# Patient Record
Sex: Female | Born: 1940 | Race: Black or African American | Hispanic: No | Smoking: Never smoker
Health system: Southern US, Community
[De-identification: ages and names within clinical notes are randomized; demographics above are authoritative.]

---

## 2008-01-17 ENCOUNTER — Ambulatory Visit: Payer: Self-pay | Admitting: Obstetrics and Gynecology

## 2008-01-21 ENCOUNTER — Ambulatory Visit: Payer: Self-pay | Admitting: Gastroenterology

## 2009-03-03 ENCOUNTER — Ambulatory Visit: Payer: Self-pay | Admitting: Obstetrics and Gynecology

## 2010-04-14 ENCOUNTER — Ambulatory Visit: Payer: Self-pay | Admitting: Obstetrics and Gynecology

## 2011-05-15 ENCOUNTER — Ambulatory Visit: Payer: Self-pay | Admitting: Obstetrics and Gynecology

## 2012-05-17 ENCOUNTER — Ambulatory Visit: Payer: Self-pay | Admitting: Obstetrics and Gynecology

## 2013-05-19 ENCOUNTER — Ambulatory Visit: Payer: Self-pay | Admitting: Obstetrics and Gynecology

## 2014-05-21 ENCOUNTER — Ambulatory Visit: Payer: Self-pay | Admitting: Obstetrics and Gynecology

## 2015-04-02 ENCOUNTER — Ambulatory Visit: Payer: Self-pay

## 2015-04-02 ENCOUNTER — Ambulatory Visit (INDEPENDENT_AMBULATORY_CARE_PROVIDER_SITE_OTHER): Payer: Medicare HMO | Admitting: Sports Medicine

## 2015-04-02 ENCOUNTER — Encounter: Payer: Self-pay | Admitting: Sports Medicine

## 2015-04-02 VITALS — BP 135/81 | HR 81

## 2015-04-02 DIAGNOSIS — M79671 Pain in right foot: Secondary | ICD-10-CM

## 2015-04-02 DIAGNOSIS — M21619 Bunion of unspecified foot: Secondary | ICD-10-CM | POA: Diagnosis not present

## 2015-04-02 DIAGNOSIS — M2040 Other hammer toe(s) (acquired), unspecified foot: Secondary | ICD-10-CM | POA: Diagnosis not present

## 2015-04-02 DIAGNOSIS — M779 Enthesopathy, unspecified: Secondary | ICD-10-CM

## 2015-04-02 MED ORDER — TRIAMCINOLONE ACETONIDE 10 MG/ML IJ SUSP: 10.0000 mg | Freq: Once | INTRAMUSCULAR | Status: AC

## 2015-04-02 NOTE — Patient Instructions (Signed)
Bunion (Hallux Valgus) A bony bump (protrusion) on the inside of the foot, at the base of the first toe, is called a bunion (hallux valgus). A bunion causes the first toe to angle toward the other toes. SYMPTOMS   A bony bump on the inside of the foot, causing an outward turning of the first toe. It may also overlap the second toe.  Thickening of the skin (callus) over the bony bump.  Fluid buildup under the callus. Fluid may become red, tender, and swollen (inflamed) with constant irritation or pressure.  Foot pain and stiffness. CAUSES  Many causes exist, including:  Inherited from your family (genetics).  Injury (trauma) forcing the first toe into a position in which it overlaps other toes.  Bunions are also associated with wearing shoes that have a narrow toe box (pointy shoes). RISK INCREASES WITH:  Family history of foot abnormalities, especially bunions.  Arthritis.  Narrow shoes, especially high heels. PREVENTION  Wear shoes with a wide toe box.  Avoid shoes with high heels.  Wear a small pad between the big toe and second toe.  Maintain proper conditioning:  Foot and ankle flexibility.  Muscle strength and endurance. PROGNOSIS  With proper treatment, bunions can typically be cured. Occasionally, surgery is required.  RELATED COMPLICATIONS   Infection of the bunion.  Arthritis of the first toe.  Risks of surgery, including infection, bleeding, injury to nerves (numb toe), recurrent bunion, overcorrection (toe points inward), arthritis of the big toe, big toe pointing upward, and bone not healing. TREATMENT  Treatment first consists of stopping the activities that aggravate the pain, taking pain medicines, and icing to reduce inflammation and pain. Wear shoes with a wide toe box. Shoes can be modified by a shoe repair person to relieve pressure on the bunion, especially if you cannot find shoes with a wide enough toe box. You may also place a pad with the  center cut out in your shoe, to reduce pressure on the bunion. Sometimes, an arch support (orthotic) may reduce pressure on the bunion and alleviate the symptoms. Stretching and strengthening exercises for the muscles of the foot may be useful. You may choose to wear a brace or pad at night to hold the big toe away from the second toe. If non-surgical treatments are not successful, surgery may be needed. Surgery involves removing the overgrown tissue and correcting the position of the first toe, by realigning the bones. Bunion surgery is typically performed on an outpatient basis, meaning you can go home the same day as surgery. The surgery may involve cutting the mid portion of the bone of the first toe, or just cutting and repairing (reconstructing) the ligaments and soft tissues around the first toe.  MEDICATION   If pain medicine is needed, nonsteroidal anti-inflammatory medicines, such as aspirin and ibuprofen, or other minor pain relievers, such as acetaminophen, are often recommended.  Do not take pain medicine for 7 days before surgery.  Prescription pain relievers are usually only prescribed after surgery. Use only as directed and only as much as you need.  Ointments applied to the skin may be helpful. HEAT AND COLD  Cold treatment (icing) relieves pain and reduces inflammation. Cold treatment should be applied for 10 to 15 minutes every 2 to 3 hours for inflammation and pain and immediately after any activity that aggravates your symptoms. Use ice packs or an ice massage.  Heat treatment may be used prior to performing the stretching and strengthening activities prescribed by your   caregiver, physical therapist, or athletic trainer. Use a heat pack or a warm soak. SEEK MEDICAL CARE IF:   Symptoms get worse or do not improve in 2 weeks, despite treatment.  After surgery, you develop fever, increasing pain, redness, swelling, drainage of fluids, bleeding, or increasing warmth around the  surgical area.  New, unexplained symptoms develop. (Drugs used in treatment may produce side effects.)   This information is not intended to replace advice given to you by your health care provider. Make sure you discuss any questions you have with your health care provider.   Document Released: 04/03/2005 Document Revised: 06/26/2011 Document Reviewed: 07/16/2008 Elsevier Interactive Patient Education 2016 [employer]  Hammer Toes Hammer toes is a condition in which one or more of your toes is permanently flexed. CAUSES  This happens when a muscle imbalance or abnormal bone length makes your small toes buckle. This causes the toe joint to contract and the strong cord-like bands that attach muscles to the bones (tendons) in your toes to shorten.  SIGNS AND SYMPTOMS  Common symptoms of flexible hammer toes include:   A buildup of skin cells (corns). Corns occur where boney bumps come in frequent contact with hard surfaces. For example, where your shoes press and rub.  Irritation.  Inflammation.  Pain.  Limited motion in your toes. DIAGNOSIS  Hammer toes are diagnosed through a physical exam of your toes. During the exam, your health care provider may try to reproduce your symptoms by manipulating your foot. Often, X-ray exams are done to determine the degree of deformity and to make sure that the cause is not a fracture.  TREATMENT  Hammer toes can be treated with corrective surgery. There are several types of surgical procedures that can treat hammer toes. The most common procedures include:  Arthroplasty--A portion of the joint is surgically removed and your toe is straightened. The gap fills in with fibrous tissue. This procedure helps treat pain and deformity and helps restore function.  Fusion--Cartilage between the two bones of the affected joint is taken out and the bones fuse together into one longer bone. This helps keep your toe stable and reduces pain but leaves your toe  stiff, yet straight.  Implantation--A portion of your bone is removed and replaced with an implant to restore motion.  Flexor tendon transfers--This procedure repositions the tendons that curl the toes down (flexor tendons). This may be done to release the deforming force that causes your toe to buckle. Several of these procedures require fixing your toe with a pin that is visible at the tip of your toe. The pin keeps the toe straight during healing. Your health care provider will remove the pin usually within 4-8 weeks after the procedure.    This information is not intended to replace advice given to you by your health care provider. Make sure you discuss any questions you have with your health care provider.   Document Released: 03/31/2000 Document Revised: 04/08/2013 Document Reviewed: 12/09/2012 Elsevier Interactive Patient Education 2016 Elsevier Inc.  

## 2015-04-02 NOTE — Progress Notes (Deleted)
   Subjective:    Patient ID: Kathleen Clements, female    DOB: [birth year], 74 y.o.   MRN: [id number]  HPI    Review of Systems  HENT: Positive for sinus pressure.   Gastrointestinal: Positive for constipation and abdominal distention.  Endocrine: Positive for polyuria.  Genitourinary: Positive for urgency.  Musculoskeletal: Positive for back pain.  Allergic/Immunologic: Positive for food allergies.  Neurological: Positive for weakness.       Objective:   Physical Exam        Assessment & Plan:

## 2015-04-02 NOTE — Progress Notes (Signed)
Patient ID: Kathleen Clements, female   DOB: Oct 11, 1940, 74 y.o.   MRN: [id number] Subjective: Kathleen Clements is a 74 y.o. female patient who presents to office for evaluation of Right 2nd toe pain. Patient complains of progressive pain especially over the last 3 months in the right foot at the second toe that starts as pain over the toe with direct pressure and range of motion; patient now has difficulty fitting some shoes comfortably. Reports that is starting to bother her at dance aerobics class.  Patient has also tried Advil minimal relief. Patient denies any other pedal complaints.   Review of Systems  HENT: Positive for sinus pressure.   Gastrointestinal: Positive for constipation and abdominal distention.  Endocrine: Positive for polyuria.  Genitourinary: Positive for urgency.  Musculoskeletal: Positive for back pain.  Allergic/Immunologic: Positive for food allergies.  Neurological: Positive for weakness.   There are no active problems to display for this patient.  No current outpatient prescriptions on file prior to visit.   No current facility-administered medications on file prior to visit.   Allergies not on file   Objective:  General: Alert and oriented x3 in no acute distress  Dermatology: No open lesions bilateral lower extremities, no webspace macerations, no ecchymosis bilateral, all nails x 10 are mildly elongated and mildly thickened. Mild callus overlying Hammer digits.  Vascular: Dorsalis Pedis and Posterior Tibial pedal pulses 2/4, Capillary Fill Time 3 seconds, (+) pedal hair growth bilateral, no edema bilateral lower extremities, Temperature gradient within normal limits.  Neurology: Michaell Cowing sensation intact via light touch bilateral.  Musculoskeletal: Mild tenderness with palpation right second toe at the distal interphalangeal joint with swelling and redness,  no limitation or crepitus with range of motion, no pain with palpation to bilateral bunion deformities,  deformity reducible, tracking not trackbound, there is no 1st ray hypermobility noted bilateral. Midtarsal, Subtalar joint, and ankle joint range of motion is within normal limits. 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  Xrays  Right foot 3 views    Impression: Mild decrease in osseous mineralization, intermetatarsal angle above normal limits and hallux abducted distant with bunion formation. There is contracture at the second toe consisting with hammertoe deformity. All other toes 3 through 5, mild contracture as well with varus rotation of the fifth toe. Pes planus foot type with no significant midtarsal breach and decrease in calcaneal inclination angle. No fractures. Soft Tissues within normal limits. No foreign body.        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 All patient's questions answered. -After verbal consent, injected into right second DIPJ for symptomatic relief 0.25cc lidocaine plain, 0.25cc marcaine plain, 0.25cc dexamethasone and kenalog 10 without complication.   -Gave patient silicone toe cap/protector to wear daily to Right  second toe -Recommend elevation, ice, and continuation with him Advil as necessary -Recommended good supportive shoe for foot type daily -As a courtesy for the patient trimmed all nails without incident -Patient to return to office in 4 weeks or sooner if condition worsens.  Kathleen Clements, DPM

## 2015-05-07 ENCOUNTER — Ambulatory Visit (INDEPENDENT_AMBULATORY_CARE_PROVIDER_SITE_OTHER): Payer: Medicare HMO | Admitting: Sports Medicine

## 2015-05-07 ENCOUNTER — Encounter: Payer: Self-pay | Admitting: Sports Medicine

## 2015-05-07 DIAGNOSIS — M21619 Bunion of unspecified foot: Secondary | ICD-10-CM | POA: Diagnosis not present

## 2015-05-07 DIAGNOSIS — M2040 Other hammer toe(s) (acquired), unspecified foot: Secondary | ICD-10-CM

## 2015-05-07 DIAGNOSIS — M79671 Pain in right foot: Secondary | ICD-10-CM | POA: Diagnosis not present

## 2015-05-07 DIAGNOSIS — M779 Enthesopathy, unspecified: Secondary | ICD-10-CM

## 2015-05-07 NOTE — Progress Notes (Signed)
Patient ID: Kathleen Clements, female   DOB: [birth year], 75 y.o.   MRN: [id number]  Subjective: Kathleen Clements is a 75 y.o. female patient who returns to office for follow up evaluation of Right 2nd toe pain. Patient states that the injection helped tremendously; there is no more pain at right 2nd toe. Patient denies any other pedal complaints.     There are no active problems to display for this patient.  Current Outpatient Prescriptions on File Prior to Visit  Medication Sig Dispense Refill  . amLODipine (NORVASC) 5 MG tablet Take by mouth.    Marland Kitchen b complex vitamins capsule Take by mouth.    . calcium-vitamin D (CALCIUM 500/D) 500-200 MG-UNIT tablet Take by mouth.    . chlorthalidone (HYGROTON) 25 MG tablet   1  . diphenhydrAMINE (BENADRYL) 25 MG tablet Take by mouth.    . Flaxseed, Linseed, (FLAXSEED OIL) 1000 MG CAPS Take by mouth.    . fluticasone (FLONASE) 50 MCG/ACT nasal spray INSTILL 1 SPRAY IN EACH NOSTRIL QD  1  . loratadine (CLARITIN) 10 MG tablet Take by mouth.    . Magnesium 200 MG TABS Take by mouth.    . montelukast (SINGULAIR) 10 MG tablet TK 1 T PO QAM  0  . Multiple Vitamin (MULTI-VITAMINS) TABS Take by mouth.    . naproxen (NAPROSYN) 500 MG tablet Take by mouth.    . naproxen (NAPROSYN) 500 MG tablet TK 1 T PO BID  1  . predniSONE (DELTASONE) 5 MG tablet 6 pills x1day, 5x1,4x1,3x1,2x1,1x1    . simvastatin (ZOCOR) 20 MG tablet Take by mouth.     Current Facility-Administered Medications on File Prior to Visit  Medication Dose Route Frequency Provider Last Rate Last Dose  . triamcinolone acetonide (KENALOG) 10 MG/ML injection 10 mg  10 mg Other Once Asencion Islam, DPM       Not on File   Objective:  General: Alert and oriented x3 in no acute distress  Dermatology: No open lesions bilateral lower extremities, no webspace macerations, no ecchymosis bilateral, all nails x 10 are mildly elongated and mildly thickened. Mild callus overlying Hammer digits.  Vascular:  Dorsalis Pedis and Posterior Tibial pedal pulses 2/4, Capillary Fill Time 3 seconds, (+) pedal hair growth bilateral, no edema bilateral lower extremities, Temperature gradient within normal limits.  Neurology: Gross sensation intact via light touch bilateral.  Musculoskeletal: No tenderness with palpation right second toe at the distal interphalangeal joint, no swelling, no redness,  no limitation or crepitus with range of motion, no pain with palpation to bilateral bunion deformities, deformity reducible, tracking not trackbound, there is no 1st ray hypermobility noted bilateral. Midtarsal, Subtalar joint, and ankle joint range of motion is within normal limits. 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       Assessment and Plan: Problem List Items Addressed This Visit    None    Visit Diagnoses    Right foot pain    -  Primary    Improved    Capsulitis        Improved    Hammertoe, unspecified laterality        Bunion           -Complete examination performed -Discussed long term plan of care -Gave patient silicone toe spacer for right 1st interspace -Recommend elevation, ice, and continuation  with Advil if flare up or recurrent pain; if fails to improve to return to office for re-eval -Recommended good supportive shoe for foot type daily -Patient to return to office as needed or sooner if condition worsens.  Asencion Islam, DPM

## 2016-02-07 ENCOUNTER — Other Ambulatory Visit: Payer: Self-pay | Admitting: Obstetrics and Gynecology

## 2016-02-07 DIAGNOSIS — Z1231 Encounter for screening mammogram for malignant neoplasm of breast: Secondary | ICD-10-CM

## 2016-03-08 ENCOUNTER — Ambulatory Visit: Payer: Self-pay

## 2016-03-08 ENCOUNTER — Ambulatory Visit
Admission: RE | Admit: 2016-03-08 | Discharge: 2016-03-08 | Disposition: A | Discharge status: Home / Self Care | Payer: Medicare HMO | Source: Ambulatory Visit | Attending: Obstetrics and Gynecology | Admitting: Obstetrics and Gynecology

## 2016-03-08 DIAGNOSIS — Z1231 Encounter for screening mammogram for malignant neoplasm of breast: Secondary | ICD-10-CM | POA: Diagnosis present

## 2016-10-23 ENCOUNTER — Emergency Department
Admission: EM | Admit: 2016-10-23 | Discharge: 2016-10-23 | Disposition: A | Discharge status: Home / Self Care | Payer: Medicare HMO | Attending: Emergency Medicine | Admitting: Emergency Medicine

## 2016-10-23 ENCOUNTER — Emergency Department: Payer: Medicare HMO

## 2016-10-23 DIAGNOSIS — Y939 Activity, unspecified: Secondary | ICD-10-CM | POA: Insufficient documentation

## 2016-10-23 DIAGNOSIS — Z79899 Other long term (current) drug therapy: Secondary | ICD-10-CM | POA: Diagnosis not present

## 2016-10-23 DIAGNOSIS — Y929 Unspecified place or not applicable: Secondary | ICD-10-CM | POA: Insufficient documentation

## 2016-10-23 DIAGNOSIS — M7918 Myalgia, other site: Secondary | ICD-10-CM

## 2016-10-23 DIAGNOSIS — Y998 Other external cause status: Secondary | ICD-10-CM | POA: Diagnosis not present

## 2016-10-23 DIAGNOSIS — S161XXA Strain of muscle, fascia and tendon at neck level, initial encounter: Secondary | ICD-10-CM

## 2016-10-23 DIAGNOSIS — M791 Myalgia: Secondary | ICD-10-CM | POA: Insufficient documentation

## 2016-10-23 DIAGNOSIS — S199XXA Unspecified injury of neck, initial encounter: Secondary | ICD-10-CM | POA: Diagnosis present

## 2016-10-23 MED ORDER — TRAMADOL HCL 50 MG PO TABS: 50.0000 mg | ORAL_TABLET | Freq: Two times a day (BID) | ORAL | 0 refills | Status: AC | PRN

## 2016-10-23 MED ORDER — NAPROXEN 500 MG PO TABS: 500.0000 mg | ORAL_TABLET | Freq: Two times a day (BID) | ORAL | 0 refills | Status: AC
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
## 2017-03-07 ENCOUNTER — Other Ambulatory Visit: Payer: Self-pay | Admitting: Obstetrics and Gynecology

## 2017-03-07 DIAGNOSIS — Z1231 Encounter for screening mammogram for malignant neoplasm of breast: Secondary | ICD-10-CM

## 2017-03-13 ENCOUNTER — Ambulatory Visit
Admission: RE | Admit: 2017-03-13 | Discharge: 2017-03-13 | Disposition: A | Discharge status: Home / Self Care | Payer: Medicare HMO | Source: Ambulatory Visit | Attending: Obstetrics and Gynecology | Admitting: Obstetrics and Gynecology

## 2017-03-13 DIAGNOSIS — Z1231 Encounter for screening mammogram for malignant neoplasm of breast: Secondary | ICD-10-CM | POA: Insufficient documentation

## 2018-03-12 ENCOUNTER — Other Ambulatory Visit: Payer: Self-pay | Admitting: Obstetrics and Gynecology

## 2018-03-12 DIAGNOSIS — Z1231 Encounter for screening mammogram for malignant neoplasm of breast: Secondary | ICD-10-CM

## 2018-04-05 ENCOUNTER — Ambulatory Visit
Admission: RE | Admit: 2018-04-05 | Discharge: 2018-04-05 | Disposition: A | Discharge status: Home / Self Care | Payer: Medicare HMO | Source: Ambulatory Visit | Attending: Obstetrics and Gynecology | Admitting: Obstetrics and Gynecology

## 2018-04-05 DIAGNOSIS — Z1231 Encounter for screening mammogram for malignant neoplasm of breast: Secondary | ICD-10-CM | POA: Diagnosis not present

## 2018-09-16 ENCOUNTER — Other Ambulatory Visit: Payer: Self-pay | Admitting: Otolaryngology

## 2018-09-16 DIAGNOSIS — J329 Chronic sinusitis, unspecified: Secondary | ICD-10-CM

## 2018-09-19 ENCOUNTER — Ambulatory Visit
Admission: RE | Admit: 2018-09-19 | Discharge: 2018-09-19 | Disposition: A | Discharge status: Home / Self Care | Payer: Medicare HMO | Source: Ambulatory Visit | Attending: Otolaryngology | Admitting: Otolaryngology

## 2018-09-19 ENCOUNTER — Encounter (INDEPENDENT_AMBULATORY_CARE_PROVIDER_SITE_OTHER): Payer: Self-pay

## 2018-09-19 DIAGNOSIS — J329 Chronic sinusitis, unspecified: Secondary | ICD-10-CM | POA: Diagnosis not present

## 2018-12-20 ENCOUNTER — Other Ambulatory Visit: Payer: Self-pay | Admitting: Internal Medicine

## 2018-12-20 DIAGNOSIS — R1010 Upper abdominal pain, unspecified: Secondary | ICD-10-CM

## 2018-12-26 ENCOUNTER — Other Ambulatory Visit: Payer: Self-pay

## 2018-12-26 ENCOUNTER — Ambulatory Visit
Admission: RE | Admit: 2018-12-26 | Discharge: 2018-12-26 | Disposition: A | Discharge status: Home / Self Care | Payer: Medicare HMO | Source: Ambulatory Visit | Attending: Internal Medicine | Admitting: Internal Medicine

## 2018-12-26 DIAGNOSIS — R1010 Upper abdominal pain, unspecified: Secondary | ICD-10-CM | POA: Insufficient documentation

## 2019-03-26 ENCOUNTER — Other Ambulatory Visit: Payer: Self-pay | Admitting: Obstetrics and Gynecology

## 2019-03-26 DIAGNOSIS — Z1231 Encounter for screening mammogram for malignant neoplasm of breast: Secondary | ICD-10-CM

## 2019-04-09 ENCOUNTER — Ambulatory Visit
Admission: RE | Admit: 2019-04-09 | Discharge: 2019-04-09 | Disposition: A | Discharge status: Home / Self Care | Payer: Medicare HMO | Source: Ambulatory Visit | Attending: Obstetrics and Gynecology | Admitting: Obstetrics and Gynecology

## 2019-04-09 DIAGNOSIS — Z1231 Encounter for screening mammogram for malignant neoplasm of breast: Secondary | ICD-10-CM | POA: Diagnosis present

## 2020-03-31 ENCOUNTER — Other Ambulatory Visit: Payer: Self-pay | Admitting: Obstetrics and Gynecology

## 2020-03-31 DIAGNOSIS — Z1231 Encounter for screening mammogram for malignant neoplasm of breast: Secondary | ICD-10-CM

## 2020-04-15 ENCOUNTER — Ambulatory Visit
Admission: RE | Admit: 2020-04-15 | Discharge: 2020-04-15 | Disposition: A | Discharge status: Home / Self Care | Payer: Medicare HMO | Source: Ambulatory Visit | Attending: Obstetrics and Gynecology | Admitting: Obstetrics and Gynecology

## 2020-04-15 ENCOUNTER — Other Ambulatory Visit: Payer: Self-pay

## 2020-04-15 DIAGNOSIS — Z1231 Encounter for screening mammogram for malignant neoplasm of breast: Secondary | ICD-10-CM | POA: Insufficient documentation

## 2020-06-09 IMAGING — MG DIGITAL SCREENING BILATERAL MAMMOGRAM WITH TOMO AND CAD
8 series · 8 of 24 positions shown · non-contrast
Comparison: Previous exam(s).

CLINICAL DATA: Screening.

EXAM:
DIGITAL SCREENING BILATERAL MAMMOGRAM WITH TOMO AND CAD

[R CC synth-2D]
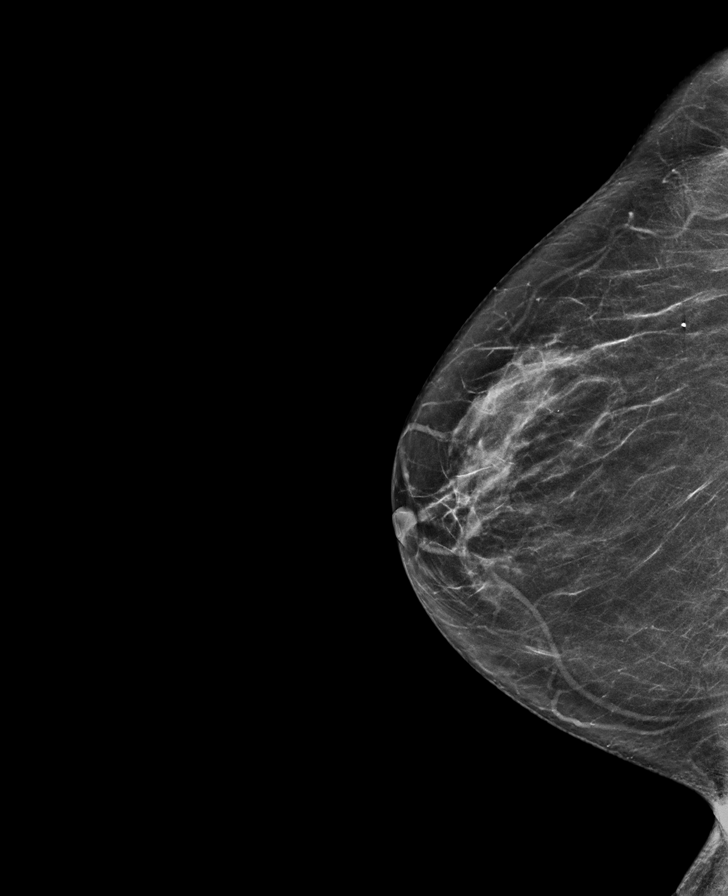

[L CC synth-2D]
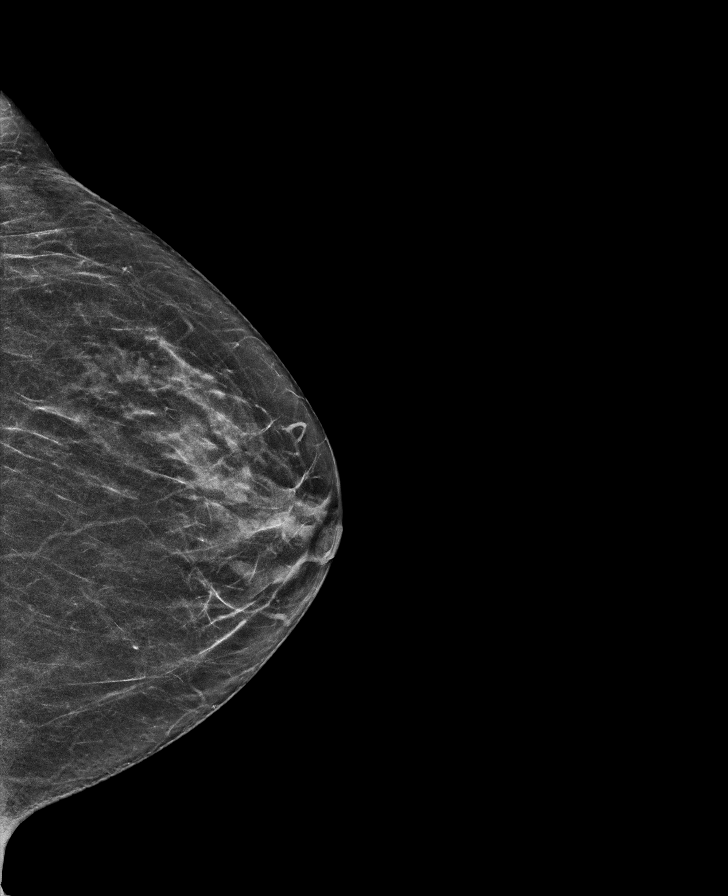

[L MLO synth-2D]
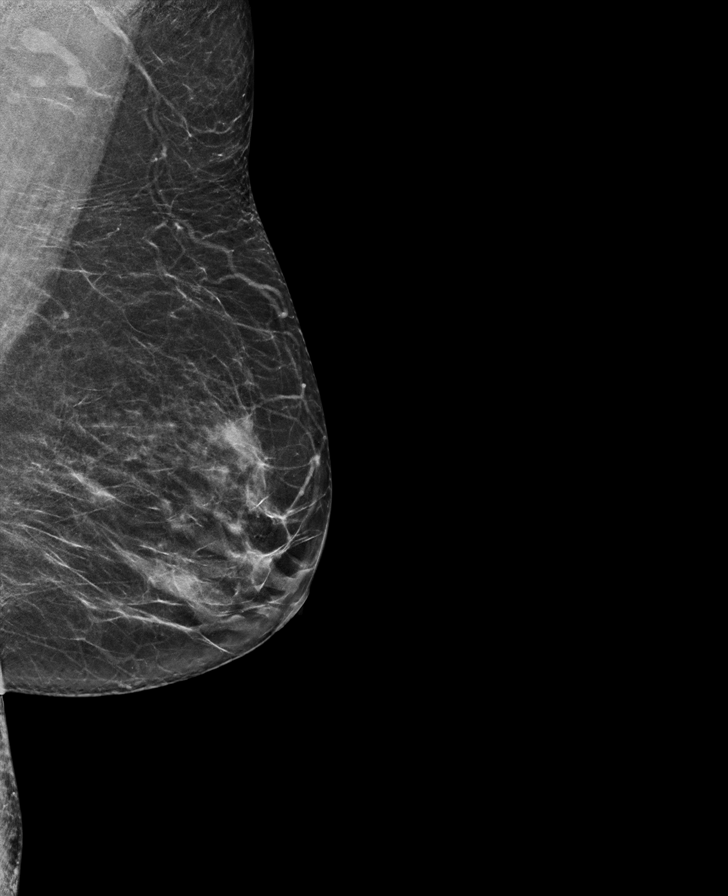

[R MLO synth-2D]
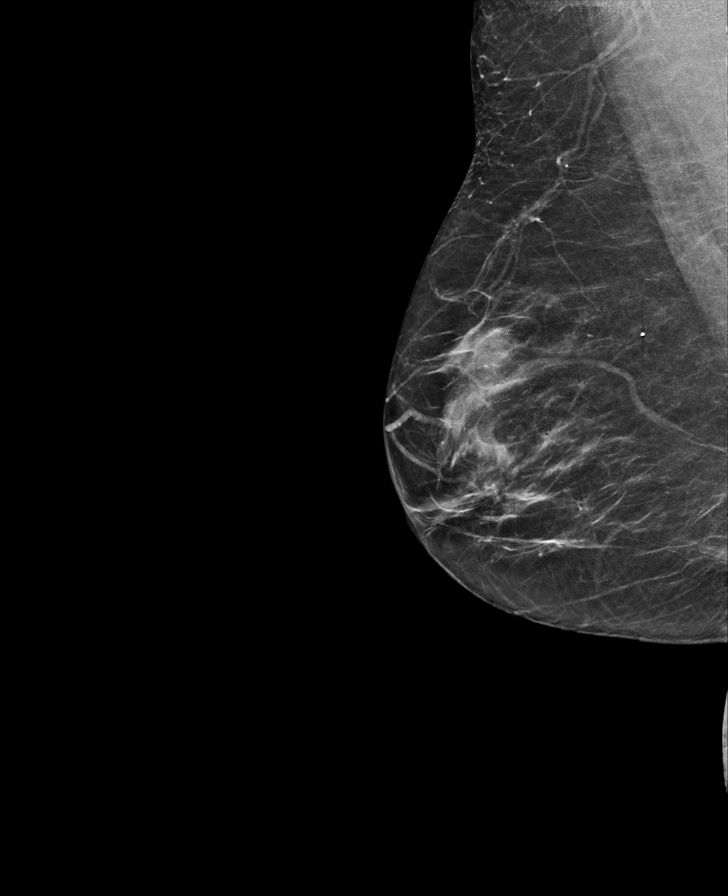

[L MLO tomo · tomo slice 31/60.0]
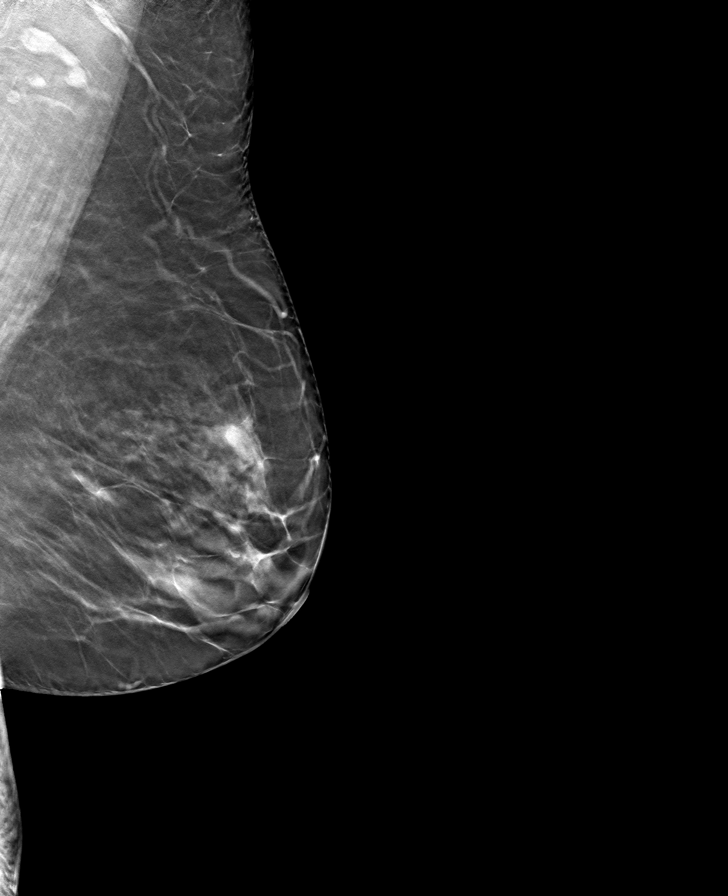

[L CC tomo · tomo slice 27/54.0]
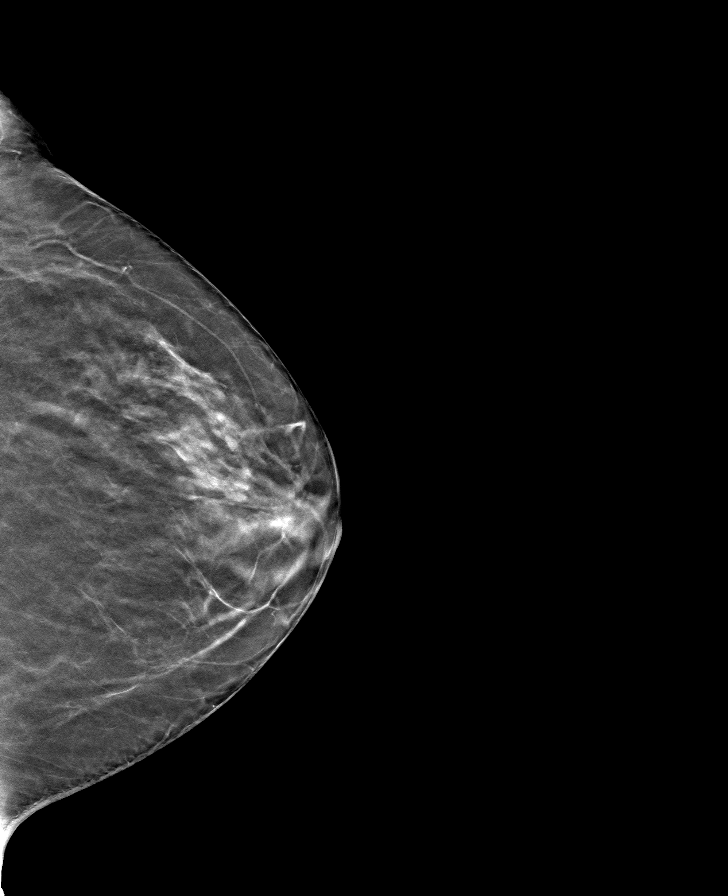

[R MLO tomo · tomo slice 31/62.0]
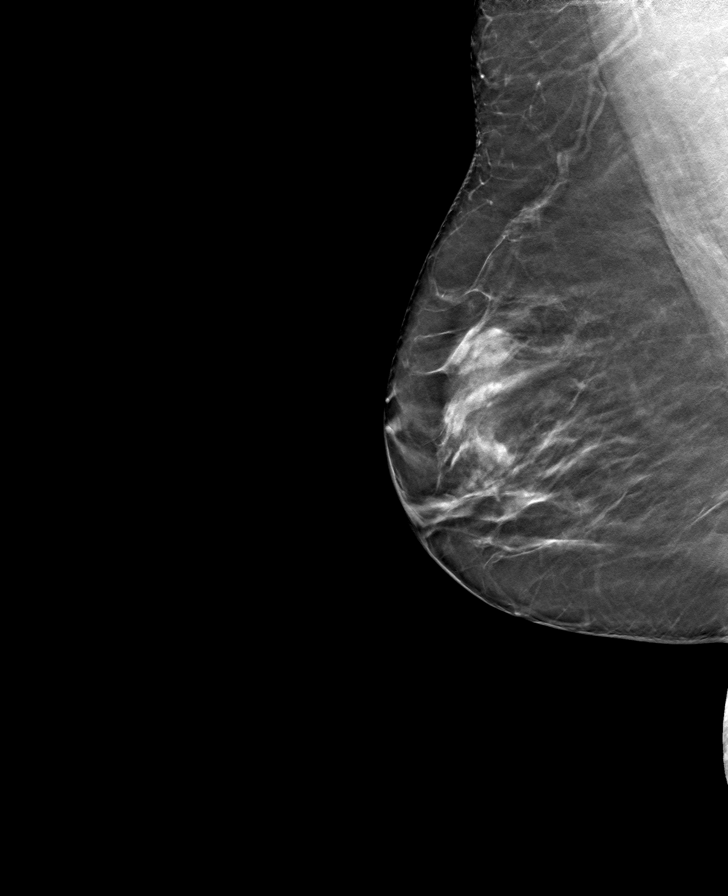

[R CC tomo · tomo slice 31/61.0]
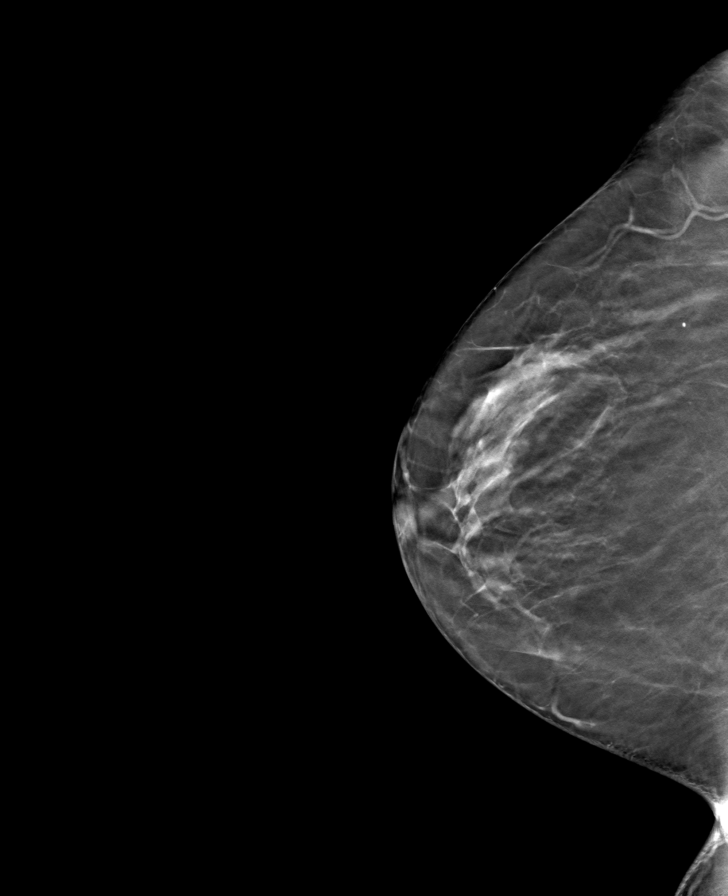

[8 of 24 positions shown; findings below may reference images not displayed]

ACR Breast Density Category b: There are scattered areas of
fibroglandular density.
FINDINGS: There are no findings suspicious for malignancy. Images were
processed with CAD.
IMPRESSION: No mammographic evidence of malignancy. A result letter of this
screening mammogram will be mailed directly to the patient.

RECOMMENDATION:
Screening mammogram in one year. (Code:[id number])

BI-RADS CATEGORY  1: Negative.

## 2021-03-16 NOTE — Congregational Nurse Program (Signed)
  Dept: [phone]   Congregational Nurse Program Note  Date of Encounter: 03/16/2021  Past Medical History: No past medical history on file.  Encounter Details:  CNP Questionnaire - 03/16/21 1501       Questionnaire   Do you give verbal consent to treat you today? Yes    Location Patient Served  Not Applicable    Visit Setting Church or Organization    Patient Status Unknown    Insurance Medicare    Insurance Referral N/A    Medication N/A    Medical Provider Yes    Screening Referrals N/A    Medical Referral Other    Medical Appointment Made N/A    Food Have Food Insecurities    Transportation N/A    Housing/Utilities N/A    Interpersonal Safety N/A    Intervention Support;Educate    ED Visit Averted N/A    Life-Saving Intervention Made N/A            [facility] to seek input re: insects she is finding in her house and the rash that she has had since June 2022.  States the bites started after she returned from a trip. Client Initially seen in clinic for consultation where this nurse referred her to [facility] in the summer. Since then, the Client followed up and then was referred to [facility] dermatology clinic. She has been seen there twice and has had biopsies. Received a cream that helps the symptoms but finally agrees that this rash is most likely bites. Client brought in a picture of numerous tiny flying bugs that she assumes are the biters. Unable to identify. Has not yet followed up with doctors  instruction to hire an exterminator. States she has to clean up first. Scattered small rasied bumps on legs, arms and abdomen. Reports aggravating itching. Prescribed cream helps so there are less now but still getting fresh bites. States the bugs don't look anything like flea bites. Received info that there was something on the bilpsy but she can't remember what so is waiting for the written report. Reinforced immportance of following up with  Dr. Re: biopsy results and obtaining exterminator. Co. Re strategies to clean environment. To follow up with this nurse prn. Rhermann, RN

## 2021-04-05 ENCOUNTER — Other Ambulatory Visit: Payer: Self-pay | Admitting: Obstetrics and Gynecology

## 2021-04-05 DIAGNOSIS — Z1231 Encounter for screening mammogram for malignant neoplasm of breast: Secondary | ICD-10-CM

## 2021-04-28 ENCOUNTER — Other Ambulatory Visit: Payer: Self-pay

## 2021-04-28 ENCOUNTER — Ambulatory Visit
Admission: RE | Admit: 2021-04-28 | Discharge: 2021-04-28 | Disposition: A | Discharge status: Home / Self Care | Payer: Medicare HMO | Source: Ambulatory Visit | Attending: Obstetrics and Gynecology | Admitting: Obstetrics and Gynecology

## 2021-04-28 DIAGNOSIS — Z1231 Encounter for screening mammogram for malignant neoplasm of breast: Secondary | ICD-10-CM | POA: Diagnosis not present

## 2021-11-23 NOTE — Congregational Nurse Program (Unsigned)
  Dept: [phone]   Congregational Nurse Program Note  Date of Encounter: 04/06/2021  Past Medical History: No past medical history on file.  Encounter Details:     Dept: [phone]   Congregational Nurse Program Note  Date of Encounter: 04/06/2021  Past Medical History: No past medical history on file.  Encounter Details:

## 2021-11-28 ENCOUNTER — Ambulatory Visit: Payer: Medicare HMO | Admitting: Certified Registered"

## 2021-11-28 ENCOUNTER — Ambulatory Visit
Admission: RE | Admit: 2021-11-28 | Discharge: 2021-11-28 | Disposition: A | Discharge status: Home / Self Care | Payer: Medicare HMO | Source: Ambulatory Visit | Attending: Gastroenterology | Admitting: Gastroenterology

## 2021-11-28 ENCOUNTER — Encounter
Admission: RE | Disposition: A | Discharge status: Home / Self Care | Payer: Self-pay | Source: Ambulatory Visit | Attending: Gastroenterology

## 2021-11-28 DIAGNOSIS — K5730 Diverticulosis of large intestine without perforation or abscess without bleeding: Secondary | ICD-10-CM | POA: Insufficient documentation

## 2021-11-28 DIAGNOSIS — Z1211 Encounter for screening for malignant neoplasm of colon: Secondary | ICD-10-CM | POA: Insufficient documentation

## 2021-11-28 DIAGNOSIS — E669 Obesity, unspecified: Secondary | ICD-10-CM | POA: Diagnosis not present

## 2021-11-28 DIAGNOSIS — Z6830 Body mass index (BMI) 30.0-30.9, adult: Secondary | ICD-10-CM | POA: Insufficient documentation

## 2021-11-28 DIAGNOSIS — I10 Essential (primary) hypertension: Secondary | ICD-10-CM | POA: Diagnosis not present

## 2021-11-28 DIAGNOSIS — Z800 Family history of malignant neoplasm of digestive organs: Secondary | ICD-10-CM | POA: Diagnosis not present

## 2021-11-28 DIAGNOSIS — K635 Polyp of colon: Secondary | ICD-10-CM | POA: Diagnosis not present

## 2021-11-28 HISTORY — PX: COLONOSCOPY WITH PROPOFOL: SHX5780

## 2021-11-28 SURGERY — COLONOSCOPY WITH PROPOFOL
Anesthesia: General

## 2021-11-28 MED ORDER — SODIUM CHLORIDE 0.9 % IV SOLN
INTRAVENOUS | Status: DC
Start: 2021-11-28 — End: 2021-11-28
  Administered 2021-11-28: 20 mL/h via INTRAVENOUS

## 2021-11-28 MED ORDER — PROPOFOL 500 MG/50ML IV EMUL
INTRAVENOUS | Status: DC | PRN
  Administered 2021-11-28: 125 ug/kg/min via INTRAVENOUS

## 2021-11-28 MED ORDER — PROPOFOL 10 MG/ML IV BOLUS
INTRAVENOUS | Status: DC | PRN
  Administered 2021-11-28: 100 mg via INTRAVENOUS

## 2021-11-28 MED ORDER — LIDOCAINE HCL (CARDIAC) PF 100 MG/5ML IV SOSY
PREFILLED_SYRINGE | INTRAVENOUS | Status: DC | PRN
  Administered 2021-11-28: 100 mg via INTRAVENOUS

## 2021-11-28 NOTE — Interval H&P Note (Signed)
History and Physical Interval Note:  11/28/2021 9:45 AM  Kathleen Clements  has presented today for surgery, with the diagnosis of CCA SCREEN.  The various methods of treatment have been discussed with the patient and family. After consideration of risks, benefits and other options for treatment, the patient has consented to  Procedure(s): COLONOSCOPY WITH PROPOFOL (N/A) as a surgical intervention.  The patient's history has been reviewed, patient examined, no change in status, stable for surgery.  I have reviewed the patient's chart and labs.  Questions were answered to the patient's satisfaction.     Regis Bill  Ok to proceed with colonoscopy

## 2021-11-28 NOTE — H&P (Signed)
Outpatient short stay form Pre-procedure 11/28/2021  Regis Bill, MD  Primary Physician: Barbette Reichmann, MD  Reason for visit:  Screening  History of present illness:    81 y/o lady with history of hypertension here for screening colonoscopy. Has 2nd degree relatives with colon cancer. Last colonoscopy in 2011 which was normal. Had negative cologuard in 2019. No blood thinners. No significant abdominal surgeries.    Current Facility-Administered Medications:    0.9 %  sodium chloride infusion, , Intravenous, Continuous, Emmett Arntz, Rossie Muskrat, MD, Last Rate: 20 mL/hr at 11/28/21 0912, 20 mL/hr at 11/28/21 0912  Facility-Administered Medications Prior to Admission  Medication Dose Route Frequency Provider Last Rate Last Admin   triamcinolone acetonide (KENALOG) 10 MG/ML injection 10 mg  10 mg Other Once Asencion Islam, DPM       Medications Prior to Admission  Medication Sig Dispense Refill Last Dose   amLODipine (NORVASC) 5 MG tablet Take by mouth.   11/28/2021 at 0530   b complex vitamins capsule Take by mouth.   11/27/2021   calcium-vitamin D (CALCIUM 500/D) 500-200 MG-UNIT tablet Take by mouth.   11/27/2021   chlorthalidone (HYGROTON) 25 MG tablet   1 11/27/2021   diphenhydrAMINE (BENADRYL) 25 MG tablet Take by mouth.   11/27/2021   Flaxseed, Linseed, (FLAXSEED OIL) 1000 MG CAPS Take by mouth.   11/27/2021   fluticasone (FLONASE) 50 MCG/ACT nasal spray INSTILL 1 SPRAY IN EACH NOSTRIL QD  1 11/27/2021   loratadine (CLARITIN) 10 MG tablet Take by mouth.   11/27/2021   Magnesium 200 MG TABS Take by mouth.   11/27/2021   montelukast (SINGULAIR) 10 MG tablet TK 1 T PO QAM  0 11/27/2021   Multiple Vitamin (MULTI-VITAMINS) TABS Take by mouth.   11/27/2021   naproxen (NAPROSYN) 500 MG tablet Take by mouth.   11/27/2021   naproxen (NAPROSYN) 500 MG tablet TK 1 T PO BID  1 11/27/2021   naproxen (NAPROSYN) 500 MG tablet Take 1 tablet (500 mg total) by mouth 2 (two) times daily with a meal. 10  tablet 0 11/27/2021   predniSONE (DELTASONE) 5 MG tablet 6 pills x1day, 5x1,4x1,3x1,2x1,1x1   11/27/2021   simvastatin (ZOCOR) 20 MG tablet Take by mouth.   11/27/2021   traMADol (ULTRAM) 50 MG tablet Take 1 tablet (50 mg total) by mouth every 12 (twelve) hours as needed. 12 tablet 0 11/27/2021     Allergies  Allergen Reactions   Advil [Ibuprofen] Rash     History reviewed. No pertinent past medical history.  Review of systems:  Otherwise negative.    Physical Exam  Gen: Alert, oriented. Appears stated age.  HEENT: PERRLA. Lungs: No respiratory distress CV: RRR Abd: soft, benign, no masses Ext: No edema    Planned procedures: Proceed with colonoscopy. The patient understands the nature of the planned procedure, indications, risks, alternatives and potential complications including but not limited to bleeding, infection, perforation, damage to internal organs and possible oversedation/side effects from anesthesia. The patient agrees and gives consent to proceed.  Please refer to procedure notes for findings, recommendations and patient disposition/instructions.     Regis Bill, MD [facility]

## 2021-11-28 NOTE — Transfer of Care (Addendum)
Immediate Anesthesia Transfer of Care Note  Patient: Kathleen Clements  Procedure(s) Performed: COLONOSCOPY WITH PROPOFOL  Patient Location: PACU and Endoscopy Unit  Anesthesia Type:General  Level of Consciousness: awake  Airway & Oxygen Therapy: Patient Spontanous Breathing  Post-op Assessment: Report given to RN  Post vital signs: stable  Last Vitals:  Vitals Value Taken Time  BP    Temp    Pulse    Resp    SpO2      Last Pain:  Vitals:   11/28/21 0852  TempSrc: Temporal  PainSc: 0-No pain         Complications: No notable events documented.

## 2021-11-28 NOTE — Op Note (Signed)
[facility] Patient Name: Kathleen Clements Procedure Date: 11/28/2021 9:44 AM MRN: [id number] Account #: [id number] Date of Birth: 26-Jan-1941 Admit Type: Outpatient Age: 81 Room: [facility] ENDO ROOM 3 Gender: Female Note Status: Finalized Instrument Name: Jasper Riling [id number] Procedure:             Colonoscopy Indications:           Screening for colorectal malignant neoplasm Providers:             Andrey Farmer MD, MD Referring MD:          Tracie Harrier, MD (Referring MD) Medicines:             Monitored Anesthesia Care Complications:         No immediate complications. Estimated blood loss:                         Minimal. Procedure:             Pre-Anesthesia Assessment:                        - Prior to the procedure, a History and Physical was                         performed, and patient medications and allergies were                         reviewed. The patient is competent. The risks and                         benefits of the procedure and the sedation options and                         risks were discussed with the patient. All questions                         were answered and informed consent was obtained.                         Patient identification and proposed procedure were                         verified by the physician, the nurse, the                         anesthesiologist, the anesthetist and the technician                         in the endoscopy suite. Mental Status Examination:                         alert and oriented. Airway Examination: normal                         oropharyngeal airway and neck mobility. Respiratory                         Examination: clear to auscultation. CV Examination:  normal. Prophylactic Antibiotics: The patient does not                         require prophylactic antibiotics. Prior                         Anticoagulants: The patient has taken no previous                          anticoagulant or antiplatelet agents. ASA Grade                         Assessment: II - A patient with mild systemic disease.                         After reviewing the risks and benefits, the patient                         was deemed in satisfactory condition to undergo the                         procedure. The anesthesia plan was to use monitored                         anesthesia care (MAC). Immediately prior to                         administration of medications, the patient was                         re-assessed for adequacy to receive sedatives. The                         heart rate, respiratory rate, oxygen saturations,                         blood pressure, adequacy of pulmonary ventilation, and                         response to care were monitored throughout the                         procedure. The physical status of the patient was                         re-assessed after the procedure.                        After obtaining informed consent, the colonoscope was                         passed under direct vision. Throughout the procedure,                         the patient's blood pressure, pulse, and oxygen                         saturations were monitored continuously. The  Colonoscope was introduced through the anus and                         advanced to the the cecum, identified by appendiceal                         orifice and ileocecal valve. The colonoscopy was                         performed without difficulty. The patient tolerated                         the procedure well. The quality of the bowel                         preparation was good. Findings:      The perianal and digital rectal examinations were normal.      A few small-mouthed diverticula were found in the sigmoid colon and       ascending colon.      Two sessile polyps were found in the sigmoid colon. The polyps were 2 to       3 mm in size. These  polyps were removed with a cold snare. Resection and       retrieval were complete. Estimated blood loss was minimal.      The exam was otherwise without abnormality on direct and retroflexion       views. Impression:            - Diverticulosis in the sigmoid colon and in the                         ascending colon.                        - Two 2 to 3 mm polyps in the sigmoid colon, removed                         with a cold snare. Resected and retrieved.                        - The examination was otherwise normal on direct and                         retroflexion views. Recommendation:        - Discharge patient to home.                        - Resume previous diet.                        - Continue present medications.                        - Await pathology results.                        - Repeat colonoscopy is not recommended due to current                         age (45 years or older) for  surveillance.                        - Return to referring physician as previously                         scheduled. Procedure Code(s):     --- Professional ---                        [phone], Colonoscopy, flexible; with removal of                         tumor(s), polyp(s), or other lesion(s) by snare                         technique Diagnosis Code(s):     --- Professional ---                        Z12.11, Encounter for screening for malignant neoplasm                         of colon                        K63.5, Polyp of colon                        K57.30, Diverticulosis of large intestine without                         perforation or abscess without bleeding CPT copyright 2019 American Medical Association. All rights reserved. The codes documented in this report are preliminary and upon coder review may  be revised to meet current compliance requirements. Andrey Farmer MD, MD 11/28/2021 10:17:16 AM Number of Addenda: 0 Note Initiated On: 11/28/2021 9:44 AM Scope Withdrawal  Time: 0 hours 10 minutes 26 seconds  Total Procedure Duration: 0 hours 20 minutes 33 seconds  Estimated Blood Loss:  Estimated blood loss was minimal.      [facility]

## 2021-11-28 NOTE — Anesthesia Preprocedure Evaluation (Signed)
Anesthesia Evaluation  Patient identified by MRN, date of birth, ID band Patient awake    Reviewed: Allergy & Precautions, NPO status , Patient's Chart, lab work & pertinent test results  Airway Mallampati: II  TM Distance: >3 FB Neck ROM: Full    Dental  (+) Teeth Intact   Pulmonary neg pulmonary ROS,    Pulmonary exam normal breath sounds clear to auscultation       Cardiovascular Exercise Tolerance: Good negative cardio ROS Normal cardiovascular exam Rhythm:Regular     Neuro/Psych negative neurological ROS  negative psych ROS   GI/Hepatic negative GI ROS, Neg liver ROS,   Endo/Other  negative endocrine ROS  Renal/GU negative Renal ROS  negative genitourinary   Musculoskeletal   Abdominal (+) + obese,   Peds negative pediatric ROS (+)  Hematology negative hematology ROS (+)   Anesthesia Other Findings History reviewed. No pertinent past medical history.  History reviewed. No pertinent surgical history.  BMI    Body Mass Index: 30.90 kg/m      Reproductive/Obstetrics negative OB ROS                             Anesthesia Physical Anesthesia Plan  ASA: 2  Anesthesia Plan: General   Post-op Pain Management:    Induction: Intravenous  PONV Risk Score and Plan: Propofol infusion and TIVA  Airway Management Planned: Natural Airway  Additional Equipment:   Intra-op Plan:   Post-operative Plan:   Informed Consent: I have reviewed the patients History and Physical, chart, labs and discussed the procedure including the risks, benefits and alternatives for the proposed anesthesia with the patient or authorized representative who has indicated his/her understanding and acceptance.     Dental Advisory Given  Plan Discussed with: CRNA and Surgeon  Anesthesia Plan Comments:         Anesthesia Quick Evaluation

## 2021-11-28 NOTE — Anesthesia Postprocedure Evaluation (Signed)
Anesthesia Post Note  Patient: Kathleen Clements  Procedure(s) Performed: COLONOSCOPY WITH PROPOFOL  Patient location during evaluation: PACU Anesthesia Type: General Level of consciousness: awake and awake and alert Pain management: satisfactory to patient Vital Signs Assessment: post-procedure vital signs reviewed and stable Respiratory status: spontaneous breathing and nonlabored ventilation Cardiovascular status: stable Anesthetic complications: no   No notable events documented.   Last Vitals:  Vitals:   11/28/21 1022 11/28/21 1030  BP: 109/63 127/76  Pulse: 75 74  Resp: 16 (!) 21  Temp:  (!) 36.1 C  SpO2: 100% 100%    Last Pain:  Vitals:   11/28/21 1030  TempSrc:   PainSc: 0-No pain                 VAN STAVEREN,Aretta Stetzel

## 2021-11-29 ENCOUNTER — Encounter: Payer: Self-pay | Admitting: Gastroenterology

## 2021-11-29 LAB — SURGICAL PATHOLOGY

## 2022-02-16 DIAGNOSIS — Z23 Encounter for immunization: Secondary | ICD-10-CM

## 2022-04-11 ENCOUNTER — Other Ambulatory Visit: Payer: Self-pay | Admitting: Obstetrics and Gynecology

## 2022-04-11 DIAGNOSIS — Z1231 Encounter for screening mammogram for malignant neoplasm of breast: Secondary | ICD-10-CM

## 2022-05-01 ENCOUNTER — Ambulatory Visit
Admission: RE | Admit: 2022-05-01 | Discharge: 2022-05-01 | Disposition: A | Discharge status: Home / Self Care | Payer: Medicare HMO | Source: Ambulatory Visit | Attending: Obstetrics and Gynecology | Admitting: Obstetrics and Gynecology

## 2022-05-01 DIAGNOSIS — Z1231 Encounter for screening mammogram for malignant neoplasm of breast: Secondary | ICD-10-CM | POA: Diagnosis not present

## 2023-01-07 ENCOUNTER — Emergency Department: Payer: Medicare HMO

## 2023-01-07 ENCOUNTER — Other Ambulatory Visit: Payer: Self-pay

## 2023-01-07 ENCOUNTER — Emergency Department
Admission: EM | Admit: 2023-01-07 | Discharge: 2023-01-07 | Disposition: A | Discharge status: Home / Self Care | Payer: Medicare HMO | Attending: Emergency Medicine | Admitting: Emergency Medicine

## 2023-01-07 DIAGNOSIS — M25562 Pain in left knee: Secondary | ICD-10-CM | POA: Diagnosis not present

## 2023-01-07 DIAGNOSIS — M25552 Pain in left hip: Secondary | ICD-10-CM | POA: Diagnosis present

## 2023-01-07 DIAGNOSIS — W19XXXA Unspecified fall, initial encounter: Secondary | ICD-10-CM

## 2023-01-07 DIAGNOSIS — S7002XA Contusion of left hip, initial encounter: Secondary | ICD-10-CM | POA: Diagnosis not present

## 2023-01-07 DIAGNOSIS — S0990XA Unspecified injury of head, initial encounter: Secondary | ICD-10-CM | POA: Diagnosis not present

## 2023-01-07 DIAGNOSIS — W010XXA Fall on same level from slipping, tripping and stumbling without subsequent striking against object, initial encounter: Secondary | ICD-10-CM | POA: Diagnosis not present

## 2023-01-07 DIAGNOSIS — M25462 Effusion, left knee: Secondary | ICD-10-CM | POA: Insufficient documentation

## 2023-01-07 DIAGNOSIS — S40022A Contusion of left upper arm, initial encounter: Secondary | ICD-10-CM

## 2023-01-07 LAB — CBC
HCT: 42 % (ref 36.0–46.0)
Hemoglobin: 13.1 g/dL (ref 12.0–15.0)
MCH: 24.4 pg — ABNORMAL LOW (ref 26.0–34.0)
MCHC: 31.2 g/dL (ref 30.0–36.0)
MCV: 78.2 fL — ABNORMAL LOW (ref 80.0–100.0)
Platelets: 271 10*3/uL (ref 150–400)
RBC: 5.37 MIL/uL — ABNORMAL HIGH (ref 3.87–5.11)
RDW: 16.4 % — ABNORMAL HIGH (ref 11.5–15.5)
WBC: 8.4 10*3/uL (ref 4.0–10.5)
nRBC: 0 % (ref 0.0–0.2)

## 2023-01-07 LAB — BASIC METABOLIC PANEL
Anion gap: 7 (ref 5–15)
BUN: 17 mg/dL (ref 8–23)
CO2: 26 mmol/L (ref 22–32)
Calcium: 8.7 mg/dL — ABNORMAL LOW (ref 8.9–10.3)
Chloride: 107 mmol/L (ref 98–111)
Creatinine, Ser: 1.06 mg/dL — ABNORMAL HIGH (ref 0.44–1.00)
GFR, Estimated: 53 mL/min — ABNORMAL LOW (ref >60)
Glucose, Bld: 95 mg/dL (ref 70–99)
Potassium: 3.6 mmol/L (ref 3.5–5.1)
Sodium: 140 mmol/L (ref 135–145)

## 2023-01-07 MED ORDER — LIDOCAINE 5 % EX PTCH
2.0000 | MEDICATED_PATCH | CUTANEOUS | Status: DC
  Administered 2023-01-07: 2 via TRANSDERMAL
  Filled 2023-01-07: qty 2

## 2023-01-07 NOTE — ED Provider Notes (Signed)
[facility] Provider Note    Event Date/Time   First MD Initiated Contact with Patient 01/07/23 1424     (approximate)   History   Hip Pain, Fall, and Dizziness   HPI  Kathleen Clements is a 82 y.o. female presents to the emergency department following a fall.  Patient states that she was out of town and tripped on some mats that were outside causing her to fall forward hitting her head, left arm and left leg.  Complaining of pain in her left hip and leg, left forearm and some dizziness since that time.  Not on anticoagulation.  Denies any chest pain or shortness of breath.  Denies any urinary symptoms.  Denies any change in vision.  Complaining of diffuse headache.  No jaw claudication.  States that she did not have a headache prior to the fall.     Physical Exam   Triage Vital Signs: ED Triage Vitals  Encounter Vitals Group     BP 01/07/23 1347 123/84     Systolic BP Percentile --      Diastolic BP Percentile --      Pulse Rate 01/07/23 1347 65     Resp 01/07/23 1347 17     Temp 01/07/23 1347 98.6 F (37 C)     Temp Source 01/07/23 1347 Oral     SpO2 01/07/23 1347 98 %     Weight 01/07/23 1342 187 lb (84.8 kg)     Height 01/07/23 1342 5\' 4"  (1.626 m)     Head Circumference --      Peak Flow --      Pain Score 01/07/23 1342 8     Pain Loc --      Pain Education --      Exclude from Growth Chart --     Most recent vital signs: Vitals:   01/07/23 1347  BP: 123/84  Pulse: 65  Resp: 17  Temp: 98.6 F (37 C)  SpO2: 98%    Physical Exam Constitutional:      Appearance: She is well-developed.  HENT:     Head: Atraumatic.  Eyes:     Conjunctiva/sclera: Conjunctivae normal.  Cardiovascular:     Rate and Rhythm: Regular rhythm.  Pulmonary:     Effort: No respiratory distress.  Abdominal:     General: There is no distension.  Musculoskeletal:        General: Normal range of motion.     Cervical back: Normal range of motion. No  tenderness.     Comments: Soft tissue swelling to the left upper arm and left knee and left hip.  Able to ambulate in the emergency department.  No midline cervical, thoracic or lumbar tenderness to palpation.  No significant gait instability.  Skin:    General: Skin is warm.  Neurological:     Mental Status: She is alert. Mental status is at baseline.      IMPRESSION / MDM / ASSESSMENT AND PLAN / ED COURSE  I reviewed the triage vital signs and the nursing notes.  Differential diagnosis including postconcussive syndrome, intracranial hemorrhage, fracture, dislocation, electrolyte abnormality, dehydration   RADIOLOGY I independently reviewed imaging, my interpretation of imaging: CT scan of the head with no acute intracranial hemorrhage or infarction  X-ray imaging with no acute fracture or dislocation.   Labs (all labs ordered are listed, but only abnormal results are displayed) Labs interpreted as -    Labs Reviewed  BASIC METABOLIC PANEL -  Abnormal; Notable for the following components:      Result Value   Creatinine, Ser 1.06 (*)    Calcium 8.7 (*)    GFR, Estimated 53 (*)    All other components within normal limits  CBC - Abnormal; Notable for the following components:   RBC 5.37 (*)    MCV 78.2 (*)    MCH 24.4 (*)    RDW 16.4 (*)    All other components within normal limits  URINALYSIS, ROUTINE W REFLEX MICROSCOPIC  CBG MONITORING, ED    No significant lab work abnormality.  No symptoms of urinary tract infection.  No change in vision or unilateral headache, no jaw claudication have a low suspicion for giant cell arteritis.  CT scan of the head without signs of intracranial hemorrhage or signs of fracture.  X-ray imaging with no acute fracture or dislocation.  Most likely with postconcussive syndrome.  Discussed close follow-up with primary care physician and given return precautions.  Discussed symptomatic treatment.     PROCEDURES:  Critical Care performed:  No  Procedures  Patient's presentation is most consistent with acute presentation with potential threat to life or bodily function.   MEDICATIONS ORDERED IN ED: Medications  lidocaine (LIDODERM) 5 % 2 patch (2 patches Transdermal Patch Applied 01/07/23 1628)    FINAL CLINICAL IMPRESSION(S) / ED DIAGNOSES   Final diagnoses:  Fall, initial encounter  Arm contusion, left, initial encounter  Contusion of left hip, initial encounter  Pain and swelling of left knee     Rx / DC Orders   ED Discharge Orders     None        Note:  This document was prepared using Dragon voice recognition software and may include unintentional dictation errors.   Corena Herter, MD 01/07/23 2002

## 2023-01-07 NOTE — Discharge Instructions (Signed)
You are seen in the emergency department after having a fall.  You had a CT scan done of your head and x-rays that did not show any broken bones or internal bleeding.  Your lab work was overall normal.  Follow-up with your primary care physician.  Concern that you have contusions to your soft tissue from the fall.  You can alternate ibuprofen and Tylenol for pain control.  Apply ice to help with the swelling.  You can use Lidoderm patches for pain control.  Return to the emergency department if you have any worsening symptoms.  Pain control:  Ibuprofen (motrin/aleve/advil) - You can take 3 tablets (600 mg) every 6 hours as needed for pain/fever.  Acetaminophen (tylenol) - You can take 2 extra strength tablets (1000 mg) every 6 hours as needed for pain/fever.  You can alternate these medications or take them together.  Make sure you eat food/drink water when taking these medications.  Thank you for choosing [place] for your health care, it was my pleasure to care for you today!  Corena Herter, MD

## 2023-01-07 NOTE — ED Triage Notes (Signed)
Arrives from [facility] for ED evaluation.  Patient fell three days ago and for 3 days c/o blurred vision, dizzziness and temple pain.  AAOx3.  Skin warm and dry. NAD

## 2023-01-07 NOTE — ED Triage Notes (Signed)
Pt states Thursday night she was walking, tripped on a rug and is now having pain to the left side of the body. Pt states she may have hit her head, but she had no loss of consciousness. Pt states that she is having left eye problems and some dizziness as well.

## 2023-04-25 ENCOUNTER — Other Ambulatory Visit: Payer: Self-pay | Admitting: Obstetrics and Gynecology

## 2023-04-25 DIAGNOSIS — Z1231 Encounter for screening mammogram for malignant neoplasm of breast: Secondary | ICD-10-CM

## 2023-05-03 ENCOUNTER — Ambulatory Visit
Admission: RE | Admit: 2023-05-03 | Discharge: 2023-05-03 | Disposition: A | Discharge status: Home / Self Care | Payer: Medicare HMO | Source: Ambulatory Visit | Attending: Obstetrics and Gynecology | Admitting: Obstetrics and Gynecology

## 2023-05-03 DIAGNOSIS — Z1231 Encounter for screening mammogram for malignant neoplasm of breast: Secondary | ICD-10-CM | POA: Insufficient documentation

## 2023-10-24 ENCOUNTER — Other Ambulatory Visit: Payer: Self-pay | Admitting: Internal Medicine

## 2023-10-24 DIAGNOSIS — R0781 Pleurodynia: Secondary | ICD-10-CM

## 2023-10-24 DIAGNOSIS — K5909 Other constipation: Secondary | ICD-10-CM

## 2023-11-01 ENCOUNTER — Ambulatory Visit
Admission: RE | Admit: 2023-11-01 | Discharge: 2023-11-01 | Disposition: A | Discharge status: Home / Self Care | Source: Ambulatory Visit | Attending: Internal Medicine | Admitting: Internal Medicine

## 2023-11-01 DIAGNOSIS — K5909 Other constipation: Secondary | ICD-10-CM | POA: Diagnosis present

## 2023-11-01 DIAGNOSIS — R0781 Pleurodynia: Secondary | ICD-10-CM | POA: Diagnosis present

## 2024-04-30 ENCOUNTER — Other Ambulatory Visit: Payer: Self-pay | Admitting: Obstetrics and Gynecology

## 2024-04-30 DIAGNOSIS — Z1231 Encounter for screening mammogram for malignant neoplasm of breast: Secondary | ICD-10-CM

## 2024-05-26 ENCOUNTER — Encounter
